# Patient Record
Sex: Male | Born: 2000 | Race: Black or African American | Hispanic: No | Marital: Single | Smoking: Never smoker
Health system: Southern US, Community
[De-identification: ages and names within clinical notes are randomized; demographics above are authoritative.]

---
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
## 2019-07-21 ENCOUNTER — Other Ambulatory Visit: Payer: Self-pay

## 2019-07-21 DIAGNOSIS — Z20822 Contact with and (suspected) exposure to covid-19: Secondary | ICD-10-CM

## 2019-07-23 LAB — NOVEL CORONAVIRUS, NAA: SARS-CoV-2, NAA: NOT DETECTED

## 2019-07-23 LAB — SPECIMEN STATUS REPORT

## 2022-06-19 ENCOUNTER — Encounter (HOSPITAL_BASED_OUTPATIENT_CLINIC_OR_DEPARTMENT_OTHER): Payer: Self-pay | Admitting: Family Medicine

## 2022-06-19 ENCOUNTER — Ambulatory Visit (HOSPITAL_BASED_OUTPATIENT_CLINIC_OR_DEPARTMENT_OTHER): Payer: Self-pay | Admitting: Family Medicine

## 2022-06-19 DIAGNOSIS — M25562 Pain in left knee: Secondary | ICD-10-CM

## 2022-06-19 DIAGNOSIS — G8929 Other chronic pain: Secondary | ICD-10-CM

## 2022-06-19 NOTE — Progress Notes (Signed)
    Procedures performed today:    None.  Independent interpretation of notes and tests performed by another provider:   None.  Brief History, Exam, Impression, and Recommendations:    BP 120/66   Pulse (!) 108   Ht 5\' 11"  (1.803 m)   Wt 148 lb 11.2 oz (67.4 kg)   SpO2 100%   BMI 20.74 kg/m   Left knee pain Patient presents for evaluation of left knee pain. He is accompanied today by his mom. Pain started around Oct 2022 - first noted when playing basketball. Will be worse with deep flexion. Pain mostly anterior knee. Some associated swelling, this was first noticed a couple months later. It has been persistent. Has noticed swelling over anterior knee. Has not noticed any instability. Did not keep him from participating during the season. Has tried heat, ice, NSAID. Denies any prior injuries to the knee. Currently just working out on his own. Training with team will likely begin in October - he is attending [employer]. Left knee: No obvious deformity. No effusion.  Negative patellar grind.  Negative crepitus. Full ROM for flexion and extension.  Strength 5 out of 5 for flexion and extension. Anterior drawer: Negative Posterior drawer: Negative Lachman: Negative Varus stress test: Negative Valgus stress test: Negative McMurray's: Negative Thessaly: Negative Apley's compression test: Negative Neurovascularly intact.  No evidence of lymphatic disease.  Overall, exam findings today are reassuring.  No evidence of any laxity, no indication of meniscal pathology.  Area of location of pain based on history would suggest possible patellofemoral issue or patellar tendinitis.  Exam today does not strongly suggest particular etiology. At this time, would continue with conservative measures including home exercises, working with [employer] [employer] at school.  Given duration of symptoms, feel that x-ray imaging would also be reasonable.  He does not currently have insurance coverage but his  mother is assisting with patient obtaining this.  I have placed order for x-rays to be completed which they can do at the earliest convenience.  If they would like to hold off until they have their insurance situation straightened out, this would be fine Discussed that if symptoms do return whether with injury or certain activity, to contact the office and schedule for office visit so we may evaluate him at time of active symptoms Otherwise no restriction in activities at this time  Return if symptoms worsen or fail to improve.   ___________________________________________ Suzannah Bettes de Event organiser, MD, ABFM, CAQSM Primary Care and Sports Medicine [facility]

## 2022-06-19 NOTE — Assessment & Plan Note (Addendum)
Patient presents for evaluation of left knee pain. He is accompanied today by his mom. Pain started around Oct 2022 - first noted when playing basketball. Will be worse with deep flexion. Pain mostly anterior knee. Some associated swelling, this was first noticed a couple months later. It has been persistent. Has noticed swelling over anterior knee. Has not noticed any instability. Did not keep him from participating during the season. Has tried heat, ice, NSAID. Denies any prior injuries to the knee. Currently just working out on his own. Training with team will likely begin in October - he is attending [employer] Left knee: No obvious deformity. No effusion.  Negative patellar grind.  Negative crepitus. Full ROM for flexion and extension.  Strength 5 out of 5 for flexion and extension. Anterior drawer: Negative Posterior drawer: Negative Lachman: Negative Varus stress test: Negative Valgus stress test: Negative McMurray's: Negative Thessaly: Negative Apley's compression test: Negative Neurovascularly intact.  No evidence of lymphatic disease.  Overall, exam findings today are reassuring.  No evidence of any laxity, no indication of meniscal pathology.  Area of location of pain based on history would suggest possible patellofemoral issue or patellar tendinitis.  Exam today does not strongly suggest particular etiology. At this time, would continue with conservative measures including home exercises, working with Event organ[employer] at school.  Given duration of symptoms, feel that x-ray imaging would also be reasonable.  He does not currently have insurance coverage but his mother is assisting with patient obtaining this.  I have placed order for x-rays to be completed which they can do at the earliest convenience.  If they would like to hold off until they have their insurance situation straightened out, this would be fine Discussed that if symptoms do return whether with injury or certain activity,  to contact the office and schedule for office visit so we may evaluate him at time of active symptoms Otherwise no restriction in activities at this time

## 2022-06-19 NOTE — Patient Instructions (Signed)
  Medication Instructions:  Your physician recommends that you continue on your current medications as directed. Please refer to the Current Medication list given to you today. --If you need a refill on any your medications before your next appointment, please call your pharmacy first. If no refills are authorized on file call the office.-- Lab Work: Your physician has recommended that you have lab work today: No If you have labs (blood work) drawn today and your tests are completely normal, you will receive your results via MyChart message OR a phone call from our staff.  Please ensure you check your voicemail in the event that you authorized detailed messages to be left on a delegated number. If you have any lab test that is abnormal or we need to change your treatment, we will call you to review the results.  Referrals/Procedures/Imaging: X-ray   Follow-Up: Your next appointment:   Your physician recommends that you schedule a follow-up appointment in as needed with Dr. de [place].  You will receive a text message or e-mail with a link to a survey about your care and experience with [place] today! We would greatly appreciate your feedback!   Thanks for letting us be apart of your health journey!!  Primary Care and Sports Medicine   Dr. Ceasar Mons [place]   We encourage you to activate your patient portal called "MyChart".  Sign up information is provided on this After Visit Summary.  MyChart is used to connect with patients for Virtual Visits (Telemedicine).  Patients are able to view lab/test results, encounter notes, upcoming appointments, etc.  Non-urgent messages can be sent to your provider as well. To learn more about what you can do with MyChart, please visit --  [email or web address].

## 2022-07-11 ENCOUNTER — Ambulatory Visit (HOSPITAL_BASED_OUTPATIENT_CLINIC_OR_DEPARTMENT_OTHER): Payer: Self-pay | Admitting: Family Medicine

## 2022-07-11 ENCOUNTER — Encounter (HOSPITAL_BASED_OUTPATIENT_CLINIC_OR_DEPARTMENT_OTHER): Payer: Self-pay | Admitting: Family Medicine

## 2022-07-11 DIAGNOSIS — Z025 Encounter for examination for participation in sport: Secondary | ICD-10-CM

## 2022-07-11 NOTE — Progress Notes (Signed)
    Procedures performed today:    None.  Independent interpretation of notes and tests performed by another provider:   None.  Brief History, Exam, Impression, and Recommendations:    BP 133/71   Pulse 64   Temp 97.8 F (36.6 C) (Oral)   Ht 5\' 11"  (1.803 m)   Wt 155 lb (70.3 kg)   SpO2 100%   BMI 21.62 kg/m   Sports physical Patient presents for sports physical today.  He has documentation with him required by the school.  Only prior exclusion from sports was related to concussion in the past.  Patient had expected recovery from concussion at that time.  No residual symptoms.  He does not have any concerns today. Denies any personal history of cardiopulmonary issues.  Denies any chest pain or shortness of breath with exertion. Patient and mother deny any family history of known cardiac issues.  No sudden cardiac death in the family at a young age.  Only family history of sudden death in young age was his sister who died due to SIDS. Exam completed today which was unremarkable.  See scanned documentation for full details. Patient is medically cleared to participate in planned sports.  He has no restrictions at this time.  Required school form completed and provided to patient today.  This has also been scanned into his chart.  Return if symptoms worsen or fail to improve.   ___________________________________________ Madalen Gavin de , MD, ABFM, [facility] Primary Care and Sports Medicine [facility]

## 2022-07-11 NOTE — Patient Instructions (Signed)

## 2022-07-14 NOTE — Assessment & Plan Note (Signed)
Patient presents for sports physical today.  He has documentation with him required by the school.  Only prior exclusion from sports was related to concussion in the past.  Patient had expected recovery from concussion at that time.  No residual symptoms.  He does not have any concerns today. Denies any personal history of cardiopulmonary issues.  Denies any chest pain or shortness of breath with exertion. Patient and mother deny any family history of known cardiac issues.  No sudden cardiac death in the family at a young age.  Only family history of sudden death in young age was his sister who died due to SIDS. Exam completed today which was unremarkable.  See scanned documentation for full details. Patient is medically cleared to participate in planned sports.  He has no restrictions at this time.  Required school form completed and provided to patient today.  This has also been scanned into his chart.

## 2023-04-20 ENCOUNTER — Encounter (HOSPITAL_BASED_OUTPATIENT_CLINIC_OR_DEPARTMENT_OTHER): Payer: Self-pay | Admitting: Family Medicine

## 2023-04-20 ENCOUNTER — Ambulatory Visit (INDEPENDENT_AMBULATORY_CARE_PROVIDER_SITE_OTHER): Payer: Self-pay | Admitting: Family Medicine

## 2023-04-20 VITALS — BP 129/77 | HR 73 | Ht 70.0 in | Wt 150.2 lb

## 2023-04-20 DIAGNOSIS — Z23 Encounter for immunization: Secondary | ICD-10-CM

## 2023-04-20 DIAGNOSIS — Z021 Encounter for pre-employment examination: Secondary | ICD-10-CM | POA: Insufficient documentation

## 2023-04-20 NOTE — Progress Notes (Signed)
Complete physical exam  Patient: Rodney Taylor   DOB: 2001-07-12   21 y.o. Male  MRN: [id number]  Subjective:    Chief Complaint  Patient presents with   Employment Physical   Rodney Taylor is a 22 y.o. male who presents today for a complete physical exam. He reports consuming a general diet. Gym/ health club routine includes basketball. He generally feels well. He reports sleeping well. He does not have additional problems to discuss today.   I reviewed the past medical history, family history, social history, surgical history, and allergies today and no changes were needed.  Please see the problem list section below in epic for further details.    Depression screenings:    04/20/2023   11:51 AM 07/11/2022   10:32 AM 06/19/2022    1:45 PM  Depression screen PHQ 2/9  Decreased Interest 0 0 0  Down, Depressed, Hopeless 0 0 0  PHQ - 2 Score 0 0 0  Altered sleeping  0   Tired, decreased energy  0   Change in appetite  0   Feeling bad or failure about yourself   0   Trouble concentrating  0   Moving slowly or fidgety/restless  0   Suicidal thoughts  0   PHQ-9 Score  0   Difficult doing work/chores  Not difficult at all     Anxiety screenings:    04/20/2023   11:51 AM  GAD 7 : Generalized Anxiety Score  Nervous, Anxious, on Edge 0  Control/stop worrying 0  Worry too much - different things 0  Trouble relaxing 0  Restless 0  Easily annoyed or irritable 0  Afraid - awful might happen 0  Total GAD 7 Score 0  Anxiety Difficulty Not difficult at all    Patient Care Team: [place], Buren Kos, MD as PCP - General (Family Medicine)    Review of Systems  Constitutional:  Negative for malaise/fatigue.  Respiratory:  Negative for cough and shortness of breath.   Cardiovascular:  Negative for chest pain and palpitations.  Gastrointestinal:  Negative for abdominal pain, nausea and vomiting.  Musculoskeletal:  Negative for back pain and myalgias.  Neurological:   Negative for dizziness, weakness and headaches.  Psychiatric/Behavioral:  Negative for depression and suicidal ideas. The patient is not nervous/anxious and does not have insomnia.        Objective:    BP 129/77   Pulse 73   Ht 5\' 10"  (1.778 m)   Wt 150 lb 3.2 oz (68.1 kg)   SpO2 100%   BMI 21.55 kg/m  BP Readings from Last 3 Encounters:  04/20/23 129/77  07/11/22 133/71  06/19/22 120/66     Physical Exam Constitutional:      Appearance: Normal appearance. He is normal weight.  HENT:     Right Ear: Tympanic membrane, ear canal and external ear normal.     Left Ear: Tympanic membrane, ear canal and external ear normal.     Mouth/Throat:     Mouth: Mucous membranes are moist.     Pharynx: Oropharynx is clear.  Eyes:     Extraocular Movements: Extraocular movements intact.     Pupils: Pupils are equal, round, and reactive to light.  Cardiovascular:     Rate and Rhythm: Normal rate and regular rhythm.     Pulses: Normal pulses.     Heart sounds: Normal heart sounds.  Pulmonary:     Effort: Pulmonary effort is normal.  Breath sounds: Normal breath sounds.  Abdominal:     General: Abdomen is flat. Bowel sounds are normal.     Palpations: Abdomen is soft.  Musculoskeletal:        General: Normal range of motion.  Skin:    General: Skin is warm and dry.  Neurological:     Mental Status: He is alert.  Psychiatric:        Mood and Affect: Mood normal.        Behavior: Behavior normal.        Thought Content: Thought content normal.        Judgment: Judgment normal.         Assessment & Plan:    Routine Health Maintenance and Physical Exam  Health Maintenance  Topic Date Due   COVID-19 Vaccine (1) Never done   HPV Vaccine (1 - Male 2-dose series) Never done   HIV Screening  Never done   Hepatitis C Screening: USPSTF Recommendation to screen - Ages 52-79 yo.  Never done   Flu Shot  07/09/2023   DTaP/Tdap/Td vaccine (2 - Td or Tdap) 04/19/2033   1. Physical  exam, pre-employment Labs discussed today. Would like to defer at this time.   Review of PMH, FH, SH, medications and HM performed.  Recommend healthy diet.  Recommend approximately 150 minutes/week of moderate intensity exercise. Recommend regular dental and vision exams. Always use seatbelt/lap and shoulder restraints. Recommend using smoke alarms and checking batteries at least twice a year. Recommend using sunscreen when outside. Discussed immunization recommendations for tetanus vaccine. Patient agreed to proceed with this today.  Needs testing for TB infection for employment. Will perform PPD skin test, since patient needs results by next week. Advised patient to return to office within 48-72 hours to have test results read.  - Tdap vaccine greater than or equal to 7yo IM - PPD     Return in about 1 year (around 04/19/2024) for Physical with fasting labs or sooner with any acute concerns/illnesses.     Alyson Reedy, FNP

## 2023-04-22 ENCOUNTER — Ambulatory Visit (INDEPENDENT_AMBULATORY_CARE_PROVIDER_SITE_OTHER): Payer: Self-pay | Admitting: Family Medicine

## 2023-04-22 DIAGNOSIS — Z111 Encounter for screening for respiratory tuberculosis: Secondary | ICD-10-CM | POA: Insufficient documentation

## 2023-04-22 LAB — TB SKIN TEST
Induration: 0 mm
TB Skin Test: NEGATIVE

## 2023-04-22 NOTE — Progress Notes (Signed)
Patient presented for PPD reading. Reading negative, results posted in chart and filled out paperwork he brought in for documentation for PPD.

## 2023-04-24 ENCOUNTER — Ambulatory Visit (INDEPENDENT_AMBULATORY_CARE_PROVIDER_SITE_OTHER): Payer: Self-pay | Admitting: Orthopaedic Surgery

## 2023-04-24 DIAGNOSIS — S83269A Peripheral tear of lateral meniscus, current injury, unspecified knee, initial encounter: Secondary | ICD-10-CM

## 2023-04-24 NOTE — Progress Notes (Signed)
                                 Chief Complaint: Status post left meniscal repair     History of Present Illness:    Rodney Taylor is a 22 y.o. male presents today status post left medial lateral meniscal repair with Dr. Delton See on September 04, 2022.  Here today for further clearance as he is hoping to back to basketball.  Overall he is doing quite well.  He has no joint line tenderness.  Walks without any type of pain.  He has been back to early basketball drills without pain    Surgical History:   none  PMH/PSH/Family History/Social History/Meds/Allergies:   No past medical history on file. No past surgical history on file. Social History   Socioeconomic History   Marital status: Single    Spouse name: Not on file   Number of children: Not on file   Years of education: Not on file   Highest education level: Not on file  Occupational History   Not on file  Tobacco Use   Smoking status: Never   Smokeless tobacco: Never  Substance and Sexual Activity   Alcohol use: Not on file   Drug use: Not on file   Sexual activity: Not on file  Other Topics Concern   Not on file  Social History Narrative   Not on file   Social Determinants of Health   Financial Resource Strain: Not on file  Food Insecurity: Not on file  Transportation Needs: Not on file  Physical Activity: Not on file  Stress: Not on file  Social Connections: Not on file   No family history on file. No Known Allergies No current outpatient medications on file.   No current facility-administered medications for this visit.   No results found.  Review of Systems:   A ROS was performed including pertinent positives and negatives as documented in the HPI.  Physical Exam :   Constitutional: NAD and appears stated age Neurological: Alert and oriented Psych: Appropriate affect and cooperative There were no vitals taken for this visit.   Comprehensive Musculoskeletal Exam:    No tenderness  about medial lateral joint line, negative Lachman test.,  Negative McMurray.  Range of motion is from -3-135  Imaging:    I personally reviewed and interpreted the radiographs.   Assessment:   22 y.o. male status post left knee medial meniscal and lateral meniscal repair overall doing extremely well.  At this point strength is equalized the contralateral side.  He may return to activity without restriction  Plan :    -Return to activity fully without restriction     I personally saw and evaluated the patient, and participated in the management and treatment plan.  Huel Cote, MD Attending Physician, Orthopedic Surgery  This document was dictated using Dragon voice recognition software. A reasonable attempt at proof reading has been made to minimize errors.

## 2023-07-10 ENCOUNTER — Encounter (HOSPITAL_BASED_OUTPATIENT_CLINIC_OR_DEPARTMENT_OTHER): Payer: Medicaid Other | Admitting: Family Medicine

## 2023-10-29 ENCOUNTER — Encounter (HOSPITAL_BASED_OUTPATIENT_CLINIC_OR_DEPARTMENT_OTHER): Payer: Self-pay | Admitting: Family Medicine

## 2023-12-11 ENCOUNTER — Other Ambulatory Visit (HOSPITAL_BASED_OUTPATIENT_CLINIC_OR_DEPARTMENT_OTHER): Payer: Self-pay | Admitting: Family Medicine

## 2023-12-11 MED ORDER — AZITHROMYCIN 250 MG PO TABS: ORAL_TABLET | ORAL | 0 refills | Status: AC

## 2024-01-06 ENCOUNTER — Other Ambulatory Visit: Payer: Self-pay | Admitting: Family Medicine

## 2024-01-06 MED ORDER — IBUPROFEN 800 MG PO TABS: 800.0000 mg | ORAL_TABLET | Freq: Three times a day (TID) | ORAL | 3 refills | Status: AC | PRN
